# Patient Record
Sex: Male | Born: 1995 | Race: Black or African American | Hispanic: No | Marital: Single | Smoking: Never smoker
Health system: Southern US, Community
[De-identification: ages and names within clinical notes are randomized; demographics above are authoritative.]

## PROBLEM LIST (undated history)

## (undated) DIAGNOSIS — D573 Sickle-cell trait: Secondary | ICD-10-CM

---
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
## 2017-03-17 ENCOUNTER — Encounter (HOSPITAL_COMMUNITY): Payer: Self-pay | Admitting: Family Medicine

## 2017-03-17 ENCOUNTER — Ambulatory Visit (HOSPITAL_COMMUNITY)
Admission: EM | Admit: 2017-03-17 | Discharge: 2017-03-17 | Disposition: A | Discharge status: Home / Self Care | Payer: BLUE CROSS/BLUE SHIELD | Attending: Internal Medicine | Admitting: Internal Medicine

## 2017-03-17 DIAGNOSIS — S8001XA Contusion of right knee, initial encounter: Secondary | ICD-10-CM

## 2017-03-17 DIAGNOSIS — S40012A Contusion of left shoulder, initial encounter: Secondary | ICD-10-CM

## 2017-03-17 DIAGNOSIS — S161XXA Strain of muscle, fascia and tendon at neck level, initial encounter: Secondary | ICD-10-CM

## 2017-03-17 MED ORDER — NAPROXEN 500 MG PO TABS: 500.0000 mg | ORAL_TABLET | Freq: Two times a day (BID) | ORAL | 0 refills | Status: AC
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
## 2017-03-17 NOTE — Discharge Instructions (Addendum)
Anticipate gradual improvement in neck pain, left shoulder/chest pain, right knee pain, over the next week or two.  May get a little worse before it starts to improve but should improve steadily after about tomorrow.  Ice for 5-10 minutes to painful areas several times daily will help with pain/inflammation.  Naproxen should also help.
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
## 2019-06-19 ENCOUNTER — Other Ambulatory Visit: Payer: Self-pay

## 2019-06-19 DIAGNOSIS — Z20822 Contact with and (suspected) exposure to covid-19: Secondary | ICD-10-CM

## 2019-06-21 LAB — NOVEL CORONAVIRUS, NAA: SARS-CoV-2, NAA: NOT DETECTED

## 2019-09-08 ENCOUNTER — Other Ambulatory Visit: Payer: Self-pay

## 2019-09-08 DIAGNOSIS — Z20822 Contact with and (suspected) exposure to covid-19: Secondary | ICD-10-CM

## 2019-09-09 LAB — NOVEL CORONAVIRUS, NAA: SARS-CoV-2, NAA: NOT DETECTED

## 2019-09-11 ENCOUNTER — Telehealth: Payer: Self-pay | Admitting: Pediatrics

## 2019-09-11 NOTE — Telephone Encounter (Signed)
Patient called back and received his covid test result  °

## 2019-10-01 ENCOUNTER — Other Ambulatory Visit: Payer: Self-pay

## 2019-10-01 DIAGNOSIS — Z20822 Contact with and (suspected) exposure to covid-19: Secondary | ICD-10-CM

## 2019-10-03 LAB — NOVEL CORONAVIRUS, NAA: SARS-CoV-2, NAA: NOT DETECTED

## 2021-01-10 ENCOUNTER — Other Ambulatory Visit: Payer: Self-pay

## 2021-01-10 ENCOUNTER — Encounter (HOSPITAL_COMMUNITY): Payer: Self-pay | Admitting: *Deleted

## 2021-01-10 ENCOUNTER — Emergency Department (HOSPITAL_COMMUNITY)
Admission: EM | Admit: 2021-01-10 | Discharge: 2021-01-10 | Disposition: A | Discharge status: Home / Self Care | Payer: BC Managed Care – PPO | Attending: Emergency Medicine | Admitting: Emergency Medicine

## 2021-01-10 DIAGNOSIS — R109 Unspecified abdominal pain: Secondary | ICD-10-CM | POA: Insufficient documentation

## 2021-01-10 DIAGNOSIS — R112 Nausea with vomiting, unspecified: Secondary | ICD-10-CM | POA: Insufficient documentation

## 2021-01-10 DIAGNOSIS — K6289 Other specified diseases of anus and rectum: Secondary | ICD-10-CM | POA: Insufficient documentation

## 2021-01-10 MED ORDER — LIDOCAINE 5 % EX OINT: 1.0000 "application " | TOPICAL_OINTMENT | Freq: Three times a day (TID) | CUTANEOUS | 0 refills | Status: AC | PRN

## 2021-01-10 NOTE — Discharge Instructions (Addendum)
Apply lidocaine to area up to three times per day as needed. Recommend Metamucil daily. Keep bowels regular, should have a soft formed stool daily. Follow up with GI if pain persists.

## 2021-01-10 NOTE — ED Triage Notes (Signed)
Pt c/t rectal pain he describes as spasms.  Pain increases when he sits.  Denies blood in stools or bloody tp.  No change in bowel or bladder habits.

## 2021-01-10 NOTE — ED Provider Notes (Signed)
[facility] EMERGENCY DEPARTMENT Provider Note   CSN: [id number] Arrival date & time: 01/10/21  0840     History Chief Complaint  Patient presents with  . Rectal Pain    James Weiss is a 25 y.o. male.  25 year old male with complaint of rectal pain onset Saturday (2 days ago), denies changes in bowel habits (last bowel movement 2 days ago, normally has 4 bowel movements weekly), nausea/vomiting, abdominal pain. Pain is worse with movement (getting up from a chair). Denies blood in stools or on toilet paper. Reports able to feel something from the outside of his rectum, has applied hemorrhoid cream to area. Denies trauma to the anal intercourse. No other complaints or concerns.         Past Medical History:  Diagnosis Date  . Sickle cell trait (HCC)     There are no problems to display for this patient.   History reviewed. No pertinent surgical history.     No family history on file.  Social History   Tobacco Use  . Smoking status: Never Smoker  . Smokeless tobacco: Never Used  Substance Use Topics  . Alcohol use: No    Home Medications Prior to Admission medications   Medication Sig Start Date End Date Taking? Authorizing Provider  lidocaine (XYLOCAINE) 5 % ointment Apply 1 application topically 3 (three) times daily as needed for mild pain. 01/10/21  Yes Jeannie Fend, PA-C  naproxen (NAPROSYN) 500 MG tablet Take 1 tablet (500 mg total) by mouth 2 (two) times daily. 03/17/17   Isa Rankin, MD    Allergies    Patient has no known allergies.  Review of Systems   Review of Systems  Constitutional: Negative for chills and fever.  Respiratory: Negative for shortness of breath.   Cardiovascular: Negative for chest pain.  Gastrointestinal: Positive for rectal pain. Negative for abdominal pain, anal bleeding, blood in stool, constipation, diarrhea, nausea and vomiting.  Genitourinary: Negative for difficulty urinating and  testicular pain.  Musculoskeletal: Negative for arthralgias, back pain and myalgias.  Skin: Negative for rash and wound.  Allergic/Immunologic: Negative for immunocompromised state.  Hematological: Negative for adenopathy.  All other systems reviewed and are negative.   Physical Exam Updated Vital Signs BP (!) 121/93 (BP Location: Right Arm)   Pulse 70   Temp 98.1 F (36.7 C) (Oral)   Resp 16   Ht 5\' 8"  (1.727 m)   Wt 62.6 kg   SpO2 100%   BMI 20.98 kg/m   Physical Exam Vitals and nursing note reviewed. Exam conducted with a chaperone present.  Constitutional:      General: He is not in acute distress.    Appearance: He is well-developed. He is not diaphoretic.  HENT:     Head: Normocephalic and atraumatic.  Cardiovascular:     Rate and Rhythm: Normal rate and regular rhythm.     Pulses: Normal pulses.     Heart sounds: Normal heart sounds.  Pulmonary:     Effort: Pulmonary effort is normal.     Breath sounds: Normal breath sounds.  Abdominal:     Palpations: Abdomen is soft.     Tenderness: There is no abdominal tenderness.  Genitourinary:    Rectum: Tenderness present. No anal fissure, external hemorrhoid or internal hemorrhoid. Normal anal tone.       Comments: Small area of tenderness left side rectum without obvious fissure or induration/fluctuance  Skin:    General: Skin is warm  and dry.     Findings: No erythema or rash.  Neurological:     Mental Status: He is alert and oriented to person, place, and time.  Psychiatric:        Behavior: Behavior normal.     ED Results / Procedures / Treatments   Labs (all labs ordered are listed, but only abnormal results are displayed) Labs Reviewed - No data to display  EKG None  Radiology No results found.  Procedures Procedures   Medications Ordered in ED Medications - No data to display  ED Course  I have reviewed the triage vital signs and the nursing notes.  Pertinent labs & imaging results that  were available during my care of the patient were reviewed by me and considered in my medical decision making (see chart for details).  Clinical Course as of 01/10/21 0951  Mon Jan 10, 2021  4330 25 year old male with complaint of rectal pain x 2 days.  Chaperone present for exam, has mild tenderness the left side of the rectum, no erythema, no fluctuance, no rectal pain otherwise. Patient is given prescription for lidocaine topical to apply to area 3 times daily as needed.  Referred to GI for follow-up if pain persists.  Recommend Metamucil daily, keep stools soft and formed. [LM]    Clinical Course User Index [LM] Alden Hipp   MDM Rules/Calculators/A&P                          Final Clinical Impression(s) / ED Diagnoses Final diagnoses:  Rectal pain    Rx / DC Orders ED Discharge Orders         Ordered    lidocaine (XYLOCAINE) 5 % ointment  3 times daily PRN        01/10/21 0944           Jeannie Fend, PA-C 01/10/21 0951    Terrilee Files, MD 01/10/21 1754

## 2022-03-16 ENCOUNTER — Emergency Department (HOSPITAL_COMMUNITY): Payer: BC Managed Care – PPO

## 2022-03-16 ENCOUNTER — Emergency Department (HOSPITAL_COMMUNITY)
Admission: EM | Admit: 2022-03-16 | Discharge: 2022-03-16 | Disposition: A | Discharge status: Home / Self Care | Payer: BC Managed Care – PPO | Attending: Emergency Medicine | Admitting: Emergency Medicine

## 2022-03-16 ENCOUNTER — Encounter (HOSPITAL_COMMUNITY): Payer: Self-pay | Admitting: Emergency Medicine

## 2022-03-16 ENCOUNTER — Other Ambulatory Visit: Payer: Self-pay

## 2022-03-16 DIAGNOSIS — K529 Noninfective gastroenteritis and colitis, unspecified: Secondary | ICD-10-CM | POA: Diagnosis not present

## 2022-03-16 DIAGNOSIS — R110 Nausea: Secondary | ICD-10-CM | POA: Diagnosis not present

## 2022-03-16 DIAGNOSIS — R109 Unspecified abdominal pain: Secondary | ICD-10-CM | POA: Diagnosis present

## 2022-03-16 LAB — URINALYSIS, ROUTINE W REFLEX MICROSCOPIC
Bilirubin Urine: NEGATIVE
Glucose, UA: NEGATIVE mg/dL
Hgb urine dipstick: NEGATIVE
Ketones, ur: 5 mg/dL — AB
Leukocytes,Ua: NEGATIVE
Nitrite: NEGATIVE
Protein, ur: NEGATIVE mg/dL
Specific Gravity, Urine: >1.046 — ABNORMAL HIGH (ref 1.005–1.030)
pH: 5 (ref 5.0–8.0)

## 2022-03-16 LAB — CBC
HCT: 46.1 % (ref 39.0–52.0)
Hemoglobin: 16.5 g/dL (ref 13.0–17.0)
MCH: 30.2 pg (ref 26.0–34.0)
MCHC: 35.8 g/dL (ref 30.0–36.0)
MCV: 84.3 fL (ref 80.0–100.0)
Platelets: 230 10*3/uL (ref 150–400)
RBC: 5.47 MIL/uL (ref 4.22–5.81)
RDW: 12.9 % (ref 11.5–15.5)
WBC: 9.6 10*3/uL (ref 4.0–10.5)
nRBC: 0 % (ref 0.0–0.2)

## 2022-03-16 LAB — COMPREHENSIVE METABOLIC PANEL
ALT: 17 U/L (ref 0–44)
AST: 18 U/L (ref 15–41)
Albumin: 4.5 g/dL (ref 3.5–5.0)
Alkaline Phosphatase: 70 U/L (ref 38–126)
Anion gap: 10 (ref 5–15)
BUN: 10 mg/dL (ref 6–20)
CO2: 24 mmol/L (ref 22–32)
Calcium: 9.8 mg/dL (ref 8.9–10.3)
Chloride: 106 mmol/L (ref 98–111)
Creatinine, Ser: 1.1 mg/dL (ref 0.61–1.24)
GFR, Estimated: >60 mL/min (ref >60)
Glucose, Bld: 75 mg/dL (ref 70–99)
Potassium: 3.9 mmol/L (ref 3.5–5.1)
Sodium: 140 mmol/L (ref 135–145)
Total Bilirubin: 0.7 mg/dL (ref 0.3–1.2)
Total Protein: 7.3 g/dL (ref 6.5–8.1)

## 2022-03-16 LAB — LIPASE, BLOOD: Lipase: 24 U/L (ref 11–51)

## 2022-03-16 MED ORDER — DICYCLOMINE HCL 20 MG PO TABS: 20.0000 mg | ORAL_TABLET | Freq: Two times a day (BID) | ORAL | 0 refills | Status: AC

## 2022-03-16 MED ORDER — ONDANSETRON HCL 4 MG PO TABS: 4.0000 mg | ORAL_TABLET | Freq: Four times a day (QID) | ORAL | 0 refills | Status: AC

## 2022-03-16 MED ORDER — FENTANYL CITRATE PF 50 MCG/ML IJ SOSY
50.0000 ug | PREFILLED_SYRINGE | Freq: Once | INTRAMUSCULAR | Status: AC
  Administered 2022-03-16: 50 ug via INTRAVENOUS
  Filled 2022-03-16: qty 1

## 2022-03-16 MED ORDER — SODIUM CHLORIDE 0.9 % IV BOLUS
1000.0000 mL | Freq: Once | INTRAVENOUS | Status: AC
Start: 2022-03-16 — End: 2022-03-16
  Administered 2022-03-16: 1000 mL via INTRAVENOUS

## 2022-03-16 MED ORDER — IOHEXOL 300 MG/ML  SOLN
100.0000 mL | Freq: Once | INTRAMUSCULAR | Status: AC | PRN
  Administered 2022-03-16: 100 mL via INTRAVENOUS

## 2022-03-16 MED ORDER — CIPROFLOXACIN HCL 500 MG PO TABS: 500.0000 mg | ORAL_TABLET | Freq: Two times a day (BID) | ORAL | 0 refills | Status: AC

## 2022-03-16 NOTE — Discharge Instructions (Signed)
You were seen in the emergency department today for abdominal pain.  You do have colitis which is an inflammation of your bowel.  I am prescribing you antibiotics they will take twice a day over the next 5 days.  Additionally giving you some nausea medication and some antispasm medication called Bentyl for you to take as needed for your symptoms.  Please eat a bland diet and drink plenty of fluids including electrolyte rich fluids like Gatorade or Pedialyte.  Please return to the emergency department for worsening abdominal pain or abdominal pain with fevers.

## 2022-03-16 NOTE — ED Provider Notes (Signed)
[facility] EMERGENCY DEPARTMENT Provider Note   CSN: [id number] Arrival date & time: 03/16/22  1146     History  Chief Complaint  Patient presents with   Abdominal Pain    James Weiss is a 26 y.o. male.  With past medical history of sickle cell trait who presents emergency department with abdominal pain.  States abdominal pain began Tuesday night.  He describes the pain as stabbing or punching pain.  States that it was gradual at first but is now become constant.  He describes it as severe and at times causing him to double over.  States that eating and drinking or smoking marijuana make it worse.  He states that he went to [facility] urgent care yesterday.  From the records and his recollection it sounds as if he was having difficulties with constipation.  They obtained an x-ray which showed no obstruction but encouraged him to use MiraLAX and Dulcolax.  He states that since then he has had watery diarrhea.  He describes it as "peeing out of my rear" but not having much stool output.  He states that his pain has been constant since then and not improving.  Last bowel movement last night.  He states that he was passing gas last night.  He denies any urinary symptoms.  Having intermittent nausea without vomiting.  Denies fevers or previous abdominal surgeries.  Of note, he does smoke marijuana multiple times a day.    Abdominal Pain Associated symptoms: diarrhea and nausea   Associated symptoms: no dysuria, no fever and no vomiting       Home Medications Prior to Admission medications   Medication Sig Start Date End Date Taking? Authorizing Provider  lidocaine (XYLOCAINE) 5 % ointment Apply 1 application topically 3 (three) times daily as needed for mild pain. 01/10/21   Jeannie Fend, PA-C  naproxen (NAPROSYN) 500 MG tablet Take 1 tablet (500 mg total) by mouth 2 (two) times daily. 03/17/17   Isa Rankin, MD      Allergies    Patient has no known  allergies.    Review of Systems   Review of Systems  Constitutional:  Negative for fever.  Gastrointestinal:  Positive for abdominal pain, diarrhea and nausea. Negative for vomiting.  Genitourinary:  Negative for dysuria.  All other systems reviewed and are negative.  Physical Exam Updated Vital Signs BP (!) 144/102 (BP Location: Right Arm)   Pulse 72   Temp 98.2 F (36.8 C) (Oral)   Resp 16   SpO2 100%  Physical Exam Vitals and nursing note reviewed.  Constitutional:      General: He is not in acute distress.    Appearance: Normal appearance. He is well-developed. He is ill-appearing.  HENT:     Head: Normocephalic.     Mouth/Throat:     Mouth: Mucous membranes are moist.  Eyes:     General: No scleral icterus.    Pupils: Pupils are equal, round, and reactive to light.  Cardiovascular:     Rate and Rhythm: Normal rate and regular rhythm.     Heart sounds: Normal heart sounds. No murmur heard. Pulmonary:     Effort: Pulmonary effort is normal. No respiratory distress.     Breath sounds: Normal breath sounds.  Abdominal:     General: Abdomen is flat. Bowel sounds are decreased. There is no distension.     Palpations: Abdomen is soft.     Tenderness: There is generalized abdominal tenderness.  There is no guarding or rebound. Negative signs include Murphy's sign and McBurney's sign.     Hernia: No hernia is present.  Skin:    General: Skin is warm and dry.     Capillary Refill: Capillary refill takes less than 2 seconds.  Neurological:     General: No focal deficit present.     Mental Status: He is alert and oriented to person, place, and time.  Psychiatric:        Mood and Affect: Mood normal.        Behavior: Behavior normal.    ED Results / Procedures / Treatments   Labs (all labs ordered are listed, but only abnormal results are displayed) Labs Reviewed  URINALYSIS, ROUTINE W REFLEX MICROSCOPIC - Abnormal; Notable for the following components:      Result  Value   Specific Gravity, Urine >1.046 (*)    Ketones, ur 5 (*)    All other components within normal limits  LIPASE, BLOOD  COMPREHENSIVE METABOLIC PANEL  CBC   EKG None  Radiology CT Abdomen Pelvis W Contrast  Result Date: 03/16/2022 CLINICAL DATA:  Abdominal pain.  Watery diarrhea. EXAM: CT ABDOMEN AND PELVIS WITH CONTRAST TECHNIQUE: Multidetector CT imaging of the abdomen and pelvis was performed using the standard protocol following bolus administration of intravenous contrast. RADIATION DOSE REDUCTION: This exam was performed according to the departmental dose-optimization program which includes automated exposure control, adjustment of the mA and/or kV according to patient size and/or use of iterative reconstruction technique. CONTRAST:  OMNIPAQUE IOHEXOL 300 MG/ML  SOLN COMPARISON:  None Available. FINDINGS: Lower chest: No acute abnormality. Hepatobiliary: No focal liver abnormality is seen. No gallstones, gallbladder wall thickening, or biliary dilatation. Pancreas: Unremarkable. No pancreatic ductal dilatation or surrounding inflammatory changes. Spleen: Normal in size without focal abnormality. Adrenals/Urinary Tract: Adrenal glands are unremarkable. Kidneys are normal, without renal calculi, focal lesion, or hydronephrosis. Bladder is unremarkable. Stomach/Bowel: Stomach is within normal limits. Bowel wall thickening of the ascending and transverse colon concerning for colitis secondary to an infectious or inflammatory etiology. Appendix is normal. Vascular/Lymphatic: No significant vascular findings are present. No enlarged abdominal or pelvic lymph nodes. Reproductive: Prostate is unremarkable. Other: No abdominal wall hernia or abnormality. No abdominopelvic ascites. Musculoskeletal: No acute osseous abnormality. No aggressive osseous lesion. IMPRESSION: 1. Bowel wall thickening of the ascending and transverse colon concerning for colitis secondary to an infectious or inflammatory  etiology. Electronically Signed   By: Elige Ko M.D.   On: 03/16/2022 14:40    Procedures Procedures   Medications Ordered in ED Medications  sodium chloride 0.9 % bolus 1,000 mL (0 mLs Intravenous Stopped 03/16/22 1538)  fentaNYL (SUBLIMAZE) injection 50 mcg (50 mcg Intravenous Given 03/16/22 1319)  iohexol (OMNIPAQUE) 300 MG/ML solution 100 mL (100 mLs Intravenous Contrast Given 03/16/22 1429)    ED Course/ Medical Decision Making/ A&P                           Medical Decision Making Amount and/or Complexity of Data Reviewed Labs: ordered. Radiology: ordered.  Risk Prescription drug management.  This patient presents to the ED for concern of abdominal pain, this involves an extensive number of treatment options, and is a complaint that carries with it a high risk of complications and morbidity.  The differential diagnosis includes Acute hepatobiliary disease, pancreatitis, appendicitis, PUD, gastritis, SBO, diverticulitis, colitis, viral gastroenteritis, Crohn's, UC, torsion, epididymitis, orchitis, hernia, etc.  Co morbidities  that complicate the patient evaluation Daily marijuana use  Additional history obtained:  Additional history obtained from: Mother at bedside External records from outside source obtained and reviewed including: Urgent care visit provider note from yesterday  EKG: Not indicated  Cardiac Monitoring: Not indicated  Lab Results: I personally ordered, reviewed, and interpreted labs. Pertinent results include: CMP, CBC, lipase and urine within normal limits  Imaging Studies ordered:  I ordered imaging studies which included CT.  I independently reviewed & interpreted imaging & am in agreement with radiology impression. Imaging shows: CT abdomen pelvis with contrast shows bowel wall thickening of the ascending and transverse colon concerning for colitis secondary to infectious or inflammatory etiology  Medications  I ordered medication including  IVF for dehydration, fentanyl for pain Reevaluation of the patient after medication shows that patient improved -I reviewed the patient's home medications and did not make adjustments. -I did  prescribe new home medications.  Tests Considered: Could consider GI Panel or Cdiff but not recent travel or recent antibiotic use  Critical Interventions: None required  Consultations: None indicated  SDH None identified   ED Course:  26 year old male who presents to the emergency department with abdominal pain.  His abdominal exam is without peritoneal signs.  No evidence of an acute abdomen.  He is well-appearing although uncomfortable.  Given his work-up I low suspicion for acute hepatobiliary disease including acute cholecystitis or cholangitis.  His lipase is negative, no risk factors for acute pancreatitis.  His presentation is not consistent with PUD or gastric perforation.  UA is negative, he has no urinary symptoms I doubt acute cystitis, pyelonephritis or obstructed stone. That his labs are normal, he is having significant pain after being seen already in urgent care.  I proceeded with a CT abdomen pelvis which shows that he has colitis of the ascending and transverse colon.  There were no findings to suggest acute appendicitis, vascular catastrophe, bowel obstruction, viscus perforation, diverticulitis.  His symptoms are also not consistent with testicular torsion. He was given IV fluids and fentanyl with improvement in his symptoms. We will provide him with a prescription for ciprofloxacin as well as Zofran and Bentyl for home.  He is instructed to use Motrin as needed for pain relief but feel that his symptoms will improve with antibiotics.  He and his mother both verbalized understanding.  Given return precautions for abdominal pain with fever, or significantly worsening pain, not passing any stool or gas, intractable nausea and vomiting. I have answered all other questions at this  time After consideration of the diagnostic results and the patients response to treatment, I feel that the patent would benefit from discharge. The patient has been appropriately medically screened and/or stabilized in the ED. I have low suspicion for any other emergent medical condition which would require further screening, evaluation or treatment in the ED or require inpatient management. The patient is overall well appearing and non-toxic in appearance. They are hemodynamically stable at time of discharge.   Final Clinical Impression(s) / ED Diagnoses Final diagnoses:  Colitis    Rx / DC Orders ED Discharge Orders          Ordered    ondansetron (ZOFRAN) 4 MG tablet  Every 6 hours        03/16/22 1529    dicyclomine (BENTYL) 20 MG tablet  2 times daily        03/16/22 1529    ciprofloxacin (CIPRO) 500 MG tablet  Every 12 hours  03/16/22 1529              Cristopher PeruAutry, Issac Moure E, PA-C 03/17/22 1506    Melene PlanFloyd, Dan, DO 03/20/22 [phone]

## 2022-03-16 NOTE — ED Triage Notes (Signed)
Patient here with complaint of abdominal pain and constipation that started yesterday. Patient seen at [facility] yesterday and pescribed dulcolax. Reports watery diarrhea now but not resolution of pain. Patient is alert, oriented, and in no apparent distress at this time.

## 2022-03-16 NOTE — ED Notes (Signed)
Pt transported to CT ?

## 2022-12-20 IMAGING — CT CT ABD-PELV W/ CM
2 of 4 series · 16 of 46 positions shown, 18 images · IV contrast (APPLIED)
Comparison: None Available.

CLINICAL DATA: Abdominal pain.  Watery diarrhea.

EXAM:
CT ABDOMEN AND PELVIS WITH CONTRAST
TECHNIQUE: Multidetector CT imaging of the abdomen and pelvis was performed
using the standard protocol following bolus administration of
intravenous contrast.

[Series 3: abd/ pelvis 5.0 i30f 2 · axial · 0.88mm/px · z∈[+938,+1338]mm · 13 of 88 slices shown, 15 images]
[im 4/88  soft-tissue]
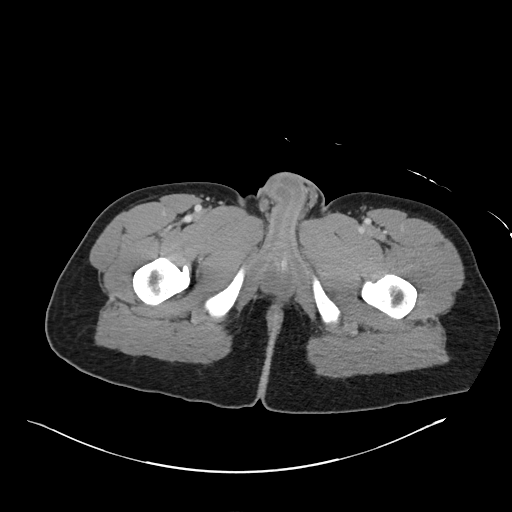
[im 4/88  bone]
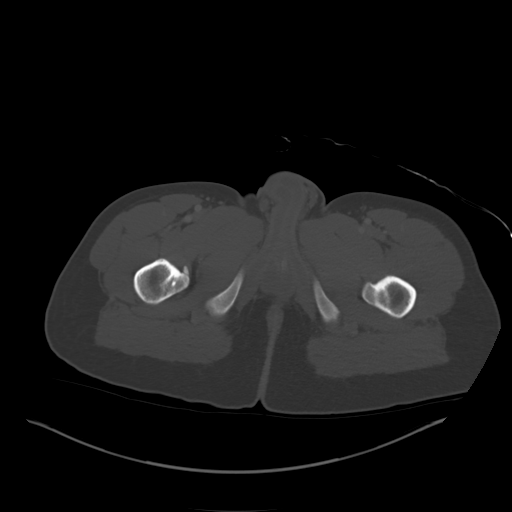
[im 11/88  soft-tissue]
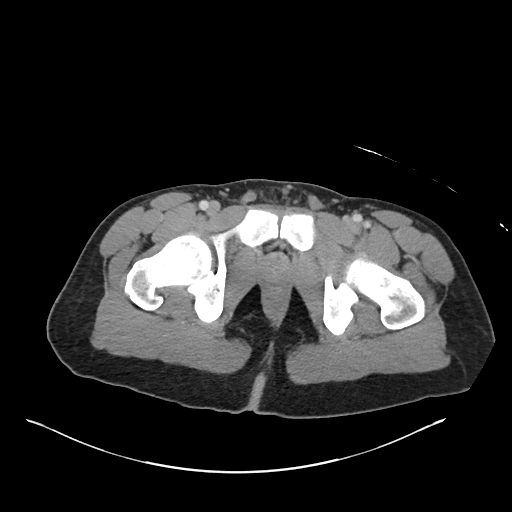
[im 18/88  soft-tissue]
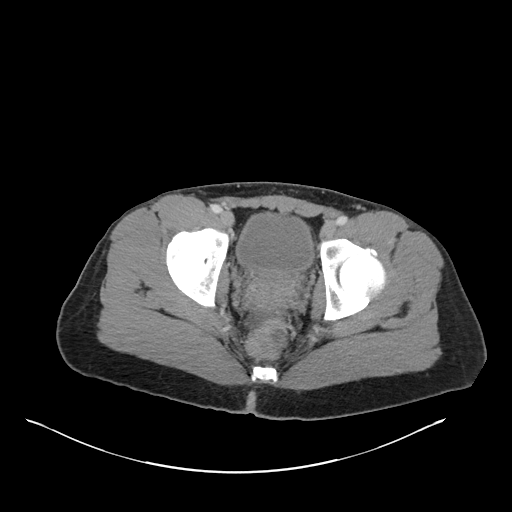
[im 25/88  soft-tissue]
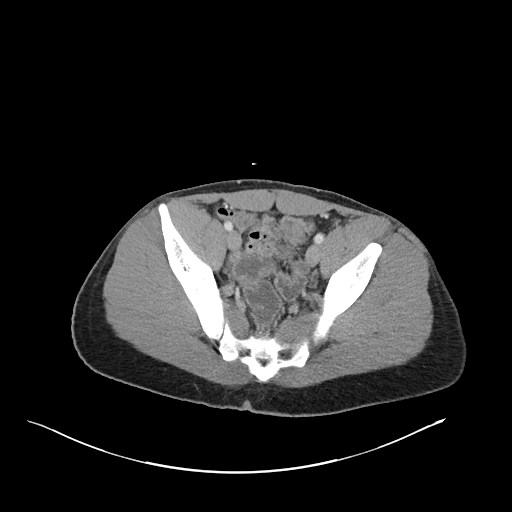
[im 32/88  soft-tissue]
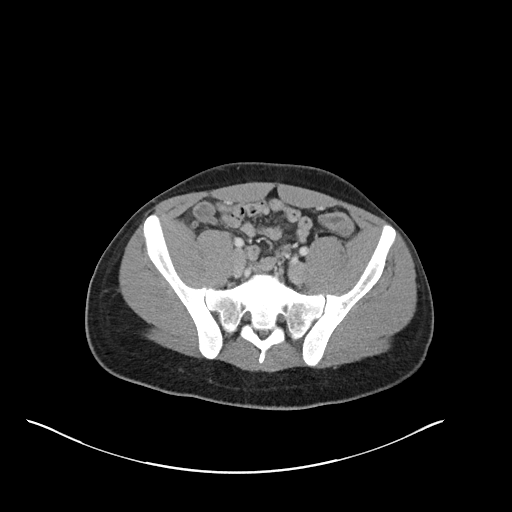
[im 39/88  soft-tissue]
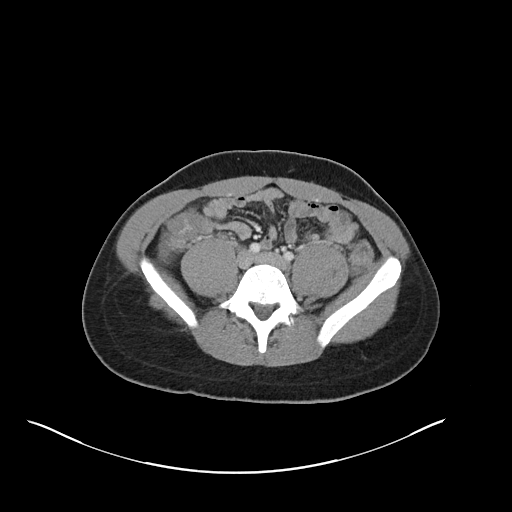
[im 46/88  soft-tissue]
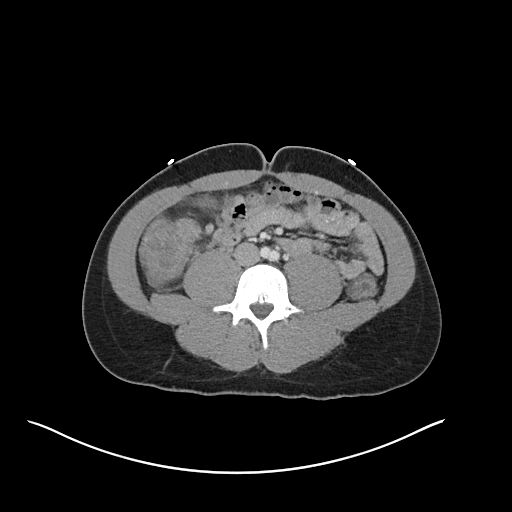
[im 49/88  soft-tissue]
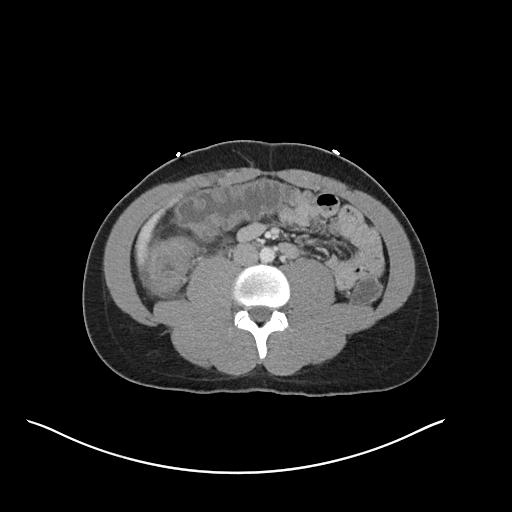
[im 56/88  soft-tissue]
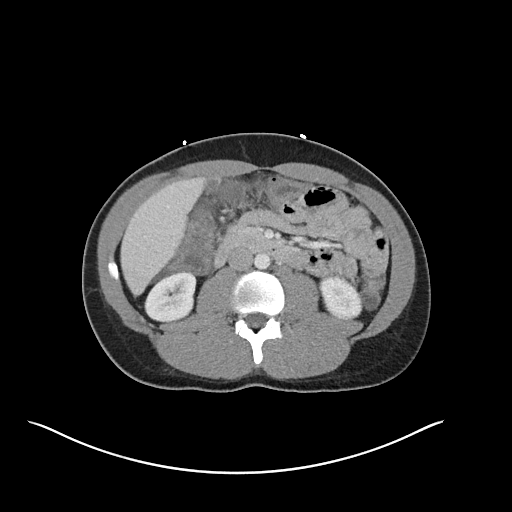
[im 56/88  bone]
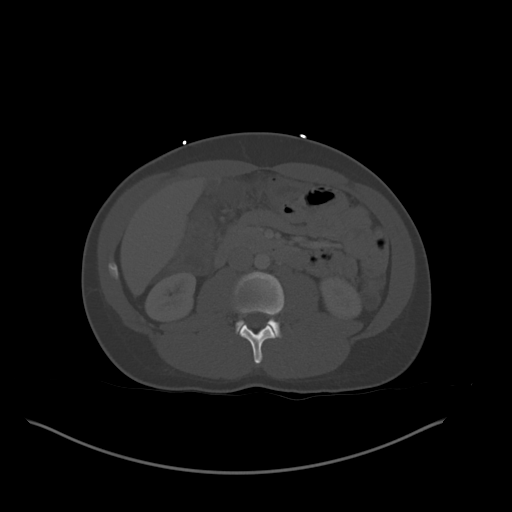
[im 63/88  soft-tissue]
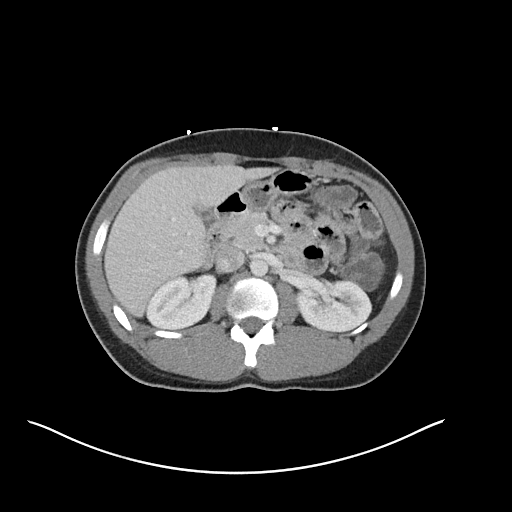
[im 70/88  soft-tissue]
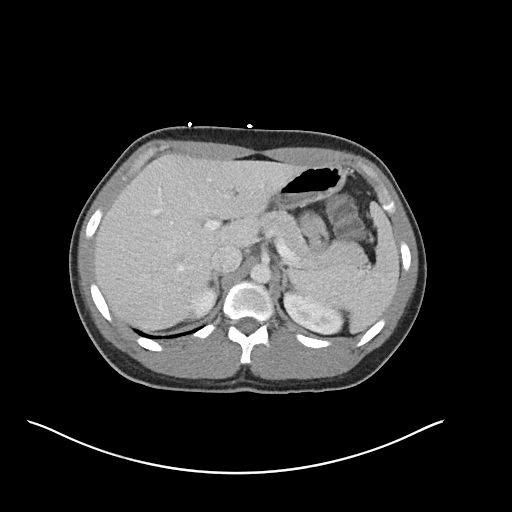
[im 77/88  soft-tissue]
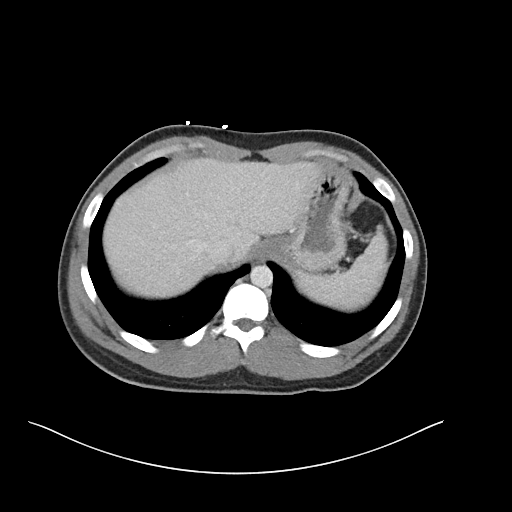
[im 84/88  soft-tissue]
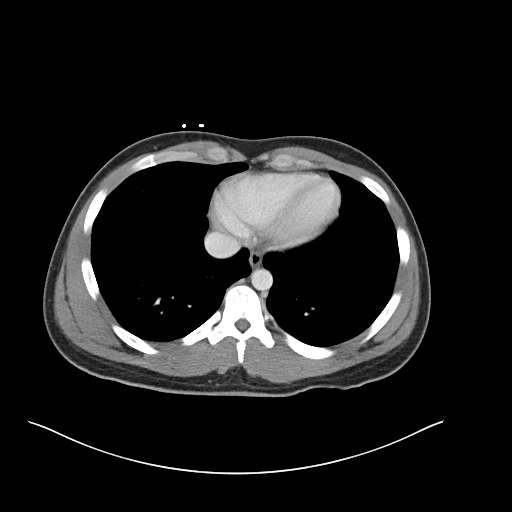

[Series 6: coronal soft tissue · coronal · 0.73mm/px · 3 of 94 slices shown]
[im 32/94  soft-tissue]
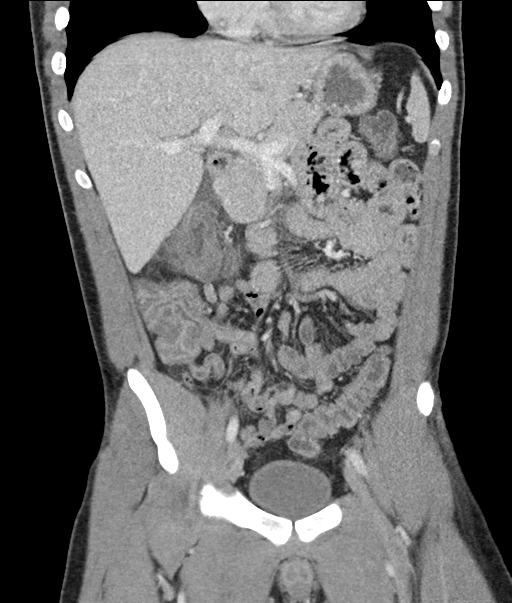
[im 42/94  soft-tissue]
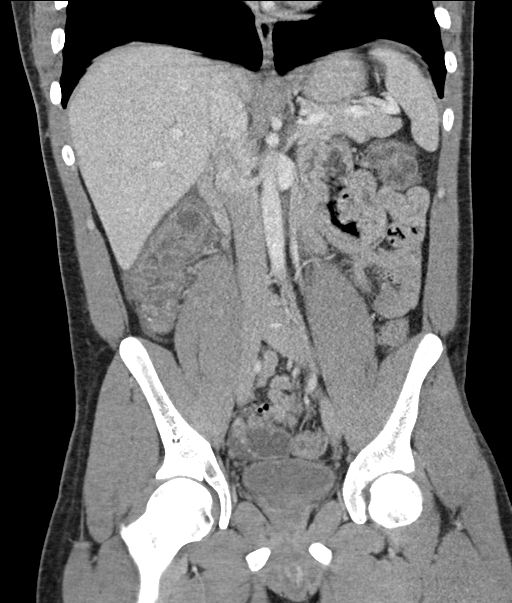
[im 52/94  soft-tissue]
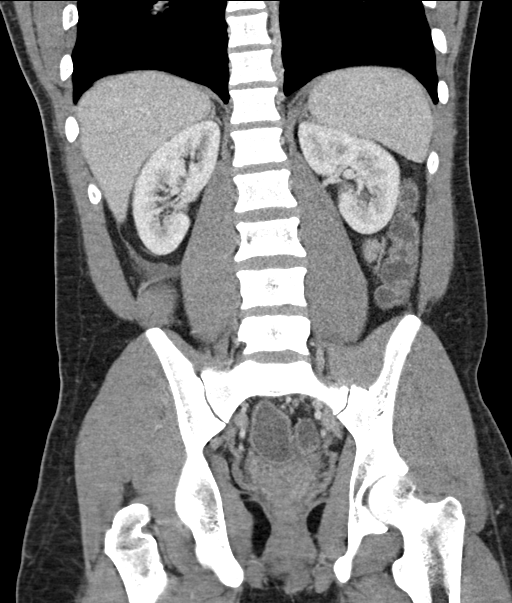

[16 of 46 positions shown; findings below may reference images not displayed]

RADIATION DOSE REDUCTION: This exam was performed according to the
departmental dose-optimization program which includes automated
exposure control, adjustment of the mA and/or kV according to
patient size and/or use of iterative reconstruction technique.

CONTRAST:  100mL OMNIPAQUE IOHEXOL 300 MG/ML  SOLN
FINDINGS: Lower chest: No acute abnormality.

Hepatobiliary: No focal liver abnormality is seen. No gallstones,
gallbladder wall thickening, or biliary dilatation.

Pancreas: Unremarkable. No pancreatic ductal dilatation or
surrounding inflammatory changes.

Spleen: Normal in size without focal abnormality.

Adrenals/Urinary Tract: Adrenal glands are unremarkable. Kidneys are
normal, without renal calculi, focal lesion, or hydronephrosis.
Bladder is unremarkable.

Stomach/Bowel: Stomach is within normal limits. Bowel wall
thickening of the ascending and transverse colon concerning for
colitis secondary to an infectious or inflammatory etiology.
Appendix is normal.

Vascular/Lymphatic: No significant vascular findings are present. No
enlarged abdominal or pelvic lymph nodes.

Reproductive: Prostate is unremarkable.

Other: No abdominal wall hernia or abnormality. No abdominopelvic
ascites.

Musculoskeletal: No acute osseous abnormality. No aggressive osseous
lesion.
IMPRESSION: 1. Bowel wall thickening of the ascending and transverse colon
concerning for colitis secondary to an infectious or inflammatory
etiology.

## 2023-03-08 ENCOUNTER — Encounter (HOSPITAL_COMMUNITY): Payer: Self-pay | Admitting: *Deleted

## 2023-03-08 ENCOUNTER — Ambulatory Visit (HOSPITAL_COMMUNITY)
Admission: EM | Admit: 2023-03-08 | Discharge: 2023-03-08 | Disposition: A | Discharge status: Home / Self Care | Payer: BC Managed Care – PPO | Attending: Emergency Medicine | Admitting: Emergency Medicine

## 2023-03-08 ENCOUNTER — Other Ambulatory Visit: Payer: Self-pay

## 2023-03-08 DIAGNOSIS — B349 Viral infection, unspecified: Secondary | ICD-10-CM

## 2023-03-08 DIAGNOSIS — J302 Other seasonal allergic rhinitis: Secondary | ICD-10-CM

## 2023-03-08 MED ORDER — FLUTICASONE PROPIONATE 50 MCG/ACT NA SUSP: 2.0000 | Freq: Every day | NASAL | 2 refills | Status: AC

## 2023-03-08 MED ORDER — CETIRIZINE HCL 10 MG PO TABS: 10.0000 mg | ORAL_TABLET | Freq: Every day | ORAL | 2 refills | Status: AC

## 2023-03-08 NOTE — ED Triage Notes (Signed)
Pt reports chills ,fever, body aches 2 days ago. Pt reports he pulled a tick off his lt side 2 weeks.

## 2023-03-08 NOTE — ED Provider Notes (Signed)
[facility]    CSN: [id number] Arrival date & time: 03/08/23  0840     History   Chief Complaint Chief Complaint  Patient presents with   Generalized Body Aches   Fever   Chills    HPI James Weiss is a 27 y.o. male.  Yesterday developed some congestion, runny nose A little scratchy throat but not pain Felt hot last night but no temp taken Took dayquil this morning that seemed to help  No eye or ear symptoms, cough, shortness of breath, abd pain, NVD, body aches, rash, fatigue  Sick contact with similar  Past Medical History:  Diagnosis Date   Sickle cell trait (HCC)     There are no problems to display for this patient.   History reviewed. No pertinent surgical history.     Home Medications    Prior to Admission medications   Medication Sig Start Date End Date Taking? Authorizing Provider  cetirizine (ZYRTEC ALLERGY) 10 MG tablet Take 1 tablet (10 mg total) by mouth daily. 03/08/23  Yes Waylon Hershey, PA-C  fluticasone (FLONASE) 50 MCG/ACT nasal spray Place 2 sprays into both nostrils daily. 03/08/23  Yes Monea Pesantez, Lurena Joiner, PA-C  dicyclomine (BENTYL) 20 MG tablet Take 1 tablet (20 mg total) by mouth 2 (two) times daily. 03/16/22   Cristopher Peru, PA-C  lidocaine (XYLOCAINE) 5 % ointment Apply 1 application topically 3 (three) times daily as needed for mild pain. 01/10/21   Jeannie Fend, PA-C  naproxen (NAPROSYN) 500 MG tablet Take 1 tablet (500 mg total) by mouth 2 (two) times daily. 03/17/17   Isa Rankin, MD  ondansetron (ZOFRAN) 4 MG tablet Take 1 tablet (4 mg total) by mouth every 6 (six) hours. 03/16/22   Cristopher Peru, PA-C    Family History History reviewed. No pertinent family history.  Social History Social History   Tobacco Use   Smoking status: Never   Smokeless tobacco: Never  Substance Use Topics   Alcohol use: No     Allergies   Patient has no known allergies.   Review of Systems Review of Systems As per  HPI  Physical Exam Triage Vital Signs ED Triage Vitals  Enc Vitals Group     BP 03/08/23 0906 123/77     Pulse Rate 03/08/23 0906 75     Resp 03/08/23 0906 18     Temp 03/08/23 0906 98.4 F (36.9 C)     Temp src --      SpO2 03/08/23 0906 99 %     Weight --      Height --      Head Circumference --      Peak Flow --      Pain Score 03/08/23 0904 0     Pain Loc --      Pain Edu? --      Excl. in GC? --    No data found.  Updated Vital Signs BP 123/77   Pulse 75   Temp 98.4 F (36.9 C)   Resp 18   SpO2 99%   Visual Acuity Right Eye Distance:   Left Eye Distance:   Bilateral Distance:    Right Eye Near:   Left Eye Near:    Bilateral Near:     Physical Exam Vitals and nursing note reviewed.  Constitutional:      General: He is not in acute distress. HENT:     Right Ear: Tympanic membrane and ear canal normal.  Left Ear: Tympanic membrane and ear canal normal.     Nose: Rhinorrhea present.     Mouth/Throat:     Mouth: Mucous membranes are moist.     Pharynx: Oropharynx is clear. No posterior oropharyngeal erythema.  Eyes:     Conjunctiva/sclera: Conjunctivae normal.  Cardiovascular:     Rate and Rhythm: Normal rate and regular rhythm.     Pulses: Normal pulses.     Heart sounds: Normal heart sounds.  Pulmonary:     Effort: Pulmonary effort is normal.     Breath sounds: Normal breath sounds.  Musculoskeletal:     Cervical back: Normal range of motion.  Lymphadenopathy:     Cervical: No cervical adenopathy.  Skin:    General: Skin is warm and dry.     Findings: No rash.  Neurological:     Mental Status: He is alert and oriented to person, place, and time.      UC Treatments / Results  Labs (all labs ordered are listed, but only abnormal results are displayed) Labs Reviewed - No data to display  EKG   Radiology No results found.  Procedures Procedures (including critical care time)  Medications Ordered in UC Medications - No data to  display  Initial Impression / Assessment and Plan / UC Course  I have reviewed the triage vital signs and the nursing notes.  Pertinent labs & imaging results that were available during my care of the patient were reviewed by me and considered in my medical decision making (see chart for details).  Afebrile, well-appearing. Discussed allergies versus virus Recommend starting once daily zyrtec, nasal spray Can use other symptomatic care as needed  He was worried because 2 weeks ago there was a tick crawling on him. He pulled it off. Reassurance provided that there is very low likelihood of tickborne illness  Can return with any concerns  Work note provided  Final Clinical Impressions(s) / UC Diagnoses   Final diagnoses:  Seasonal allergies  Viral illness     Discharge Instructions      I recommend to start once daily allergy med (such as zyrtec, allegra) Can also try once daily nasal spray like flonase Drink lots of fluids! Other symptomatic care as needed. You can return if symptoms worsen     ED Prescriptions     Medication Sig Dispense Auth. Provider   fluticasone (FLONASE) 50 MCG/ACT nasal spray Place 2 sprays into both nostrils daily. 9.9 mL Annetta Deiss, PA-C   cetirizine (ZYRTEC ALLERGY) 10 MG tablet Take 1 tablet (10 mg total) by mouth daily. 30 tablet Lucrecia Mcphearson, Lurena Joiner, PA-C      PDMP not reviewed this encounter.   Keeli Roberg, Lurena Joiner, PA-C 03/08/23 1000

## 2023-03-08 NOTE — Discharge Instructions (Signed)
I recommend to start once daily allergy med (such as zyrtec, allegra) Can also try once daily nasal spray like flonase Drink lots of fluids! Other symptomatic care as needed. You can return if symptoms worsen
